# Patient Record
Sex: Male | Born: 1975 | Race: White | Hispanic: No | Marital: Married
Health system: Southern US, Community
[De-identification: ages and names within clinical notes are randomized; demographics above are authoritative.]

---

## 2021-04-11 ENCOUNTER — Emergency Department (HOSPITAL_COMMUNITY): Payer: 59

## 2021-04-11 ENCOUNTER — Emergency Department (HOSPITAL_COMMUNITY)
Admission: EM | Admit: 2021-04-11 | Discharge: 2021-04-12 | Disposition: A | Discharge status: Home / Self Care | Payer: 59 | Attending: Emergency Medicine | Admitting: Emergency Medicine

## 2021-04-11 DIAGNOSIS — I10 Essential (primary) hypertension: Secondary | ICD-10-CM | POA: Insufficient documentation

## 2021-04-11 DIAGNOSIS — R079 Chest pain, unspecified: Secondary | ICD-10-CM | POA: Insufficient documentation

## 2021-04-11 DIAGNOSIS — M25512 Pain in left shoulder: Secondary | ICD-10-CM | POA: Insufficient documentation

## 2021-04-11 LAB — HEPATIC FUNCTION PANEL
ALT: 24 U/L (ref 0–44)
AST: 21 U/L (ref 15–41)
Albumin: 4.6 g/dL (ref 3.5–5.0)
Alkaline Phosphatase: 47 U/L (ref 38–126)
Bilirubin, Direct: <0.1 mg/dL (ref 0.0–0.2)
Total Bilirubin: 0.7 mg/dL (ref 0.3–1.2)
Total Protein: 7.1 g/dL (ref 6.5–8.1)

## 2021-04-11 LAB — CBC
HCT: 40.1 % (ref 39.0–52.0)
Hemoglobin: 13.8 g/dL (ref 13.0–17.0)
MCH: 31.3 pg (ref 26.0–34.0)
MCHC: 34.4 g/dL (ref 30.0–36.0)
MCV: 90.9 fL (ref 80.0–100.0)
Platelets: 228 10*3/uL (ref 150–400)
RBC: 4.41 MIL/uL (ref 4.22–5.81)
RDW: 14.2 % (ref 11.5–15.5)
WBC: 9.6 10*3/uL (ref 4.0–10.5)
nRBC: 0 % (ref 0.0–0.2)

## 2021-04-11 LAB — BASIC METABOLIC PANEL
Anion gap: 8 (ref 5–15)
BUN: 13 mg/dL (ref 6–20)
CO2: 26 mmol/L (ref 22–32)
Calcium: 9.9 mg/dL (ref 8.9–10.3)
Chloride: 104 mmol/L (ref 98–111)
Creatinine, Ser: 1 mg/dL (ref 0.61–1.24)
GFR, Estimated: >60 mL/min (ref >60)
Glucose, Bld: 95 mg/dL (ref 70–99)
Potassium: 3.6 mmol/L (ref 3.5–5.1)
Sodium: 138 mmol/L (ref 135–145)

## 2021-04-11 LAB — LIPASE, BLOOD: Lipase: 48 U/L (ref 11–51)

## 2021-04-11 LAB — TROPONIN I (HIGH SENSITIVITY): Troponin I (High Sensitivity): 5 ng/L (ref <18)

## 2021-04-11 NOTE — ED Triage Notes (Signed)
Pt c/o centralized "sharp," substernal CP, associated upper L arm/shoulder, back pain since this past weekend. Hx stress test, echo, compliant w all meds. ?

## 2021-04-11 NOTE — ED Provider Triage Note (Signed)
Emergency Medicine Provider Triage Evaluation Note ? ?Daniel Hawkins , a 46 y.o. male  was evaluated in triage.  Pt complains of chest pain.  Chest pain has been on and off since over the weekend.  He says it is right in his epigastric and substernal region.  He has associated shoulder blade pain, mid back pain, and left arm pain.  He is also noticed that his blood pressure has been elevated more than it normally is.  He was reading a blood pressure of systolic in the 140s which is much higher than normal.  He has been compliant with all of his blood pressure medications.  He did recently travel here from [place].  He denies any abnormal leg swelling or leg pain.  He has never had a blood clot.  He has had pretty thorough cardiology work-ups in the past and they have all been negative. ? ?Review of Systems  ?Positive:  ?Negative:  ? ?Physical Exam  ?BP (!) 153/100 (BP Location: Right Arm)   Pulse 72   Temp 98.6 ?F (37 ?C) (Oral)   Resp 16   SpO2 100%  ?Gen:   Awake, no distress   ?Resp:  Normal effort  ?MSK:   Moves extremities without difficulty  ?Other:  No epigastric tenderness. Pain not reproducible in chest and back. Pulses are equal. ? ?Medical Decision Making  ?Medically screening exam initiated at 9:48 PM.  Appropriate orders placed.  Daniel Hawkins was informed that the remainder of the evaluation will be completed by another provider, this initial triage assessment does not replace that evaluation, and the importance of remaining in the ED until their evaluation is complete. ? ? ? ?  ?Claudie Leach, PA-C ?04/11/21 2149 ? ?

## 2021-04-12 ENCOUNTER — Emergency Department (HOSPITAL_COMMUNITY): Payer: 59

## 2021-04-12 LAB — TROPONIN I (HIGH SENSITIVITY): Troponin I (High Sensitivity): 5 ng/L (ref <18)

## 2021-04-12 MED ORDER — METHOCARBAMOL 500 MG PO TABS: 500.0000 mg | ORAL_TABLET | Freq: Three times a day (TID) | ORAL | 0 refills | Status: AC | PRN

## 2021-04-12 MED ORDER — DICLOFENAC SODIUM 1 % EX GEL
4.0000 g | Freq: Four times a day (QID) | CUTANEOUS | 0 refills | Status: AC | PRN
Start: 2021-04-12 — End: ?

## 2021-04-12 NOTE — Discharge Instructions (Addendum)
You were seen in the emergency department today for chest pain.  Your work-up was overall reassuring.  Your blood work did not show any significant abnormalities and is detailed below.  Your chest x-ray was normal.  Your left shoulder x-ray did show some degenerative changes, this may be contributing to some of the symptoms you are having in the shoulder.  Your EKG and heart enzymes did not show findings of an acute heart attack. ? ?We are sending you home with Robaxin and diclofenac to try to help with pain. ? ?-Diclofenac gel: This is a nonsteroidal anti-inflammatory gel to help with pain and inflammation. ?- Robaxin- this is the muscle relaxer I have prescribed, this is meant to help with muscle tightness/spasms. Be aware that this medication may make you drowsy therefore the first time you take this it should be at a time you are in an environment where you can rest. Do not drive or operate heavy machinery when taking this medication. Do not drink alcohol or take other sedating medications with this medicine such as narcotics or benzodiazepines.  ? ?You make take Tylenol per over the counter dosing with these medications.  ? ?We have prescribed you new medication(s) today. Discuss the medications prescribed today with your pharmacist as they can have adverse effects and interactions with your other medicines including over the counter and prescribed medications. Seek medical evaluation if you start to experience new or abnormal symptoms after taking one of these medicines, seek care immediately if you start to experience difficulty breathing, feeling of your throat closing, facial swelling, or rash as these could be indications of a more serious allergic reaction ? ? ?Please follow-up with your specialists soon as possible additionally follow-up with your primary care provider.  Return to the emergency department for new or worsening symptoms or any other concerns. ? ?Results for orders placed or performed  during the hospital encounter of 04/11/21  ?Basic metabolic panel  ?Result Value Ref Range  ? Sodium 138 135 - 145 mmol/L  ? Potassium 3.6 3.5 - 5.1 mmol/L  ? Chloride 104 98 - 111 mmol/L  ? CO2 26 22 - 32 mmol/L  ? Glucose, Bld 95 70 - 99 mg/dL  ? BUN 13 6 - 20 mg/dL  ? Creatinine, Ser 1.00 0.61 - 1.24 mg/dL  ? Calcium 9.9 8.9 - 10.3 mg/dL  ? GFR, Estimated >60 >60 mL/min  ? Anion gap 8 5 - 15  ?CBC  ?Result Value Ref Range  ? WBC 9.6 4.0 - 10.5 K/uL  ? RBC 4.41 4.22 - 5.81 MIL/uL  ? Hemoglobin 13.8 13.0 - 17.0 g/dL  ? HCT 40.1 39.0 - 52.0 %  ? MCV 90.9 80.0 - 100.0 fL  ? MCH 31.3 26.0 - 34.0 pg  ? MCHC 34.4 30.0 - 36.0 g/dL  ? RDW 14.2 11.5 - 15.5 %  ? Platelets 228 150 - 400 K/uL  ? nRBC 0.0 0.0 - 0.2 %  ?Hepatic function panel  ?Result Value Ref Range  ? Total Protein 7.1 6.5 - 8.1 g/dL  ? Albumin 4.6 3.5 - 5.0 g/dL  ? AST 21 15 - 41 U/L  ? ALT 24 0 - 44 U/L  ? Alkaline Phosphatase 47 38 - 126 U/L  ? Total Bilirubin 0.7 0.3 - 1.2 mg/dL  ? Bilirubin, Direct <0.1 0.0 - 0.2 mg/dL  ? Indirect Bilirubin NOT CALCULATED 0.3 - 0.9 mg/dL  ?Lipase, blood  ?Result Value Ref Range  ? Lipase 48 11 - 51  U/L  ?Troponin I (High Sensitivity)  ?Result Value Ref Range  ? Troponin I (High Sensitivity) 5 <18 ng/L  ?Troponin I (High Sensitivity)  ?Result Value Ref Range  ? Troponin I (High Sensitivity) 5 <18 ng/L  ? ?DG Chest 1 View ? ?Result Date: 04/11/2021 ?CLINICAL DATA:  Sharp substernal chest pain. EXAM: CHEST  1 VIEW COMPARISON:  None. FINDINGS: The heart size and mediastinal contours are within normal limits. Both lungs are clear. The visualized skeletal structures are unremarkable. IMPRESSION: No active disease. Electronically Signed   By: Elgie Collard M.D.   On: 04/11/2021 22:16  ? ?DG Shoulder Left ? ?Result Date: 04/12/2021 ?CLINICAL DATA:  Pain in shoulder and clicking. EXAM: LEFT SHOULDER - 2+ VIEW COMPARISON:  None. FINDINGS: There is no evidence of fracture or dislocation. Mild degenerative changes are present at  the glenohumeral joint. Soft tissues are unremarkable. IMPRESSION: 1. No acute fracture or dislocation. 2. 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 Electronically Signed   By: Thornell Sartorius M.D.   On: 04/12/2021 03:20   ? ? ?

## 2021-04-12 NOTE — ED Provider Notes (Signed)
?[name] ?Provider Note ? ? ?CSN: [id number] ?Arrival date & time: 04/11/21  2120 ? ?  ? ?History ? ?Chief Complaint  ?Patient presents with  ? Chest Pain  ? ? ?Daniel AcheJonathan Hawkins is a 46 y.o. male with a history of hypertension who presents to the ED with complaints of chest pain and shoulder pain that has been occurring x 12 months.  Patient reports has had intermittent pain to the chest and the left shoulder for about 1 year, he states that pain is now happening almost daily, it is intermittent, lasts a few seconds at a time, feels like his shoulder is clicking.  Pain seems to be worse with left upper extremity movement.  He has been worked up by [employer] [place].  He reports that he has had reassuring echocardiogram, stress test, and upper endoscopy.  At times will have some improvement in his pain with certain blood pressure medications, however his blood pressure medication management been altered multiple times.  He tried taking a PPI for a while as he had a hiatal hernia on his upper endoscopy, however this did not help the pain made him feel weird.  Today the pain was worse prompting ED visit.  He denies nausea, vomiting, dyspnea, dizziness, numbness, weakness, or syncope. ? ?HPI ? ?  ? ?Home Medications ?Prior to Admission medications   ?Not on File  ?   ? ?Allergies    ?Patient has no allergy information on record.   ? ?Review of Systems   ?Review of Systems  ?Constitutional:  Negative for chills, diaphoresis and fever.  ?Respiratory:  Negative for shortness of breath.   ?Cardiovascular:  Positive for chest pain.  ?Gastrointestinal:  Negative for nausea and vomiting.  ?Musculoskeletal:  Positive for arthralgias.  ?Neurological:  Negative for dizziness, seizures, weakness, light-headedness and numbness.  ?All other systems reviewed and are negative. ? ?Physical Exam ?Updated Vital Signs ?BP (!) 136/98 (BP Location: Right Arm)   Pulse  84   Temp 98.6 ?F (37 ?C) (Oral)   Resp 16   SpO2 99%  ?Physical Exam ?Vitals and nursing note reviewed.  ?Constitutional:   ?   General: He is not in acute distress. ?   Appearance: Normal appearance. He is well-developed. He is not ill-appearing or toxic-appearing.  ?HENT:  ?   Head: Normocephalic and atraumatic.  ?Eyes:  ?   General:     ?   Right eye: No discharge.     ?   Left eye: No discharge.  ?   Conjunctiva/sclera: Conjunctivae normal.  ?Neck:  ?   Comments: No midline tenderness.  ?Cardiovascular:  ?   Rate and Rhythm: Normal rate and regular rhythm.  ?   Pulses:     ?     Radial pulses are 2+ on the right side and 2+ on the left side.  ?Pulmonary:  ?   Effort: No respiratory distress.  ?   Breath sounds: Normal breath sounds. No wheezing or rales.  ?Chest:  ?   Chest wall: No tenderness.  ?Abdominal:  ?   General: There is no distension.  ?   Palpations: Abdomen is soft.  ?   Tenderness: There is no abdominal tenderness.  ?Musculoskeletal:  ?   Cervical back: Normal range of motion and neck supple.  ?   Right lower leg: No edema.  ?   Left lower leg: No edema.  ?   Comments: Upper  extremities: No obvious deformity, appreciable swelling, edema, erythema, ecchymosis, warmth, or open wounds. Patient has intact AROM throughout.  No focal bony tenderness. ?No midline spinal tenderness.  ?Skin: ?   General: Skin is warm and dry.  ?   Capillary Refill: Capillary refill takes less than 2 seconds.  ?Neurological:  ?   Mental Status: He is alert.  ?   Comments: Alert. Clear speech. Sensation grossly intact to bilateral upper extremities. 5/5 symmetric grip strength. Ambulatory.   ?Psychiatric:     ?   Mood and Affect: Mood normal.     ?   Behavior: Behavior normal.  ? ? ?ED Results / Procedures / Treatments   ?Labs ?(all labs ordered are listed, but only abnormal results are displayed) ?Labs Reviewed  ?BASIC METABOLIC PANEL  ?CBC  ?HEPATIC FUNCTION PANEL  ?LIPASE, BLOOD  ?TROPONIN I (HIGH SENSITIVITY)   ?TROPONIN I (HIGH SENSITIVITY)  ? ? ?EKG ?None ? ?Radiology ?DG Chest 1 View ? ?Result Date: 04/11/2021 ?CLINICAL DATA:  Sharp substernal chest pain. EXAM: CHEST  1 VIEW COMPARISON:  None. FINDINGS: The heart size and mediastinal contours are within normal limits. Both lungs are clear. The visualized skeletal structures are unremarkable. IMPRESSION: No active disease. Electronically Signed   By: Elgie Collard M.D.   On: 04/11/2021 22:16  ? ?DG Shoulder Left ? ?Result Date: 04/12/2021 ?CLINICAL DATA:  Pain in shoulder and clicking. EXAM: LEFT SHOULDER - 2+ VIEW COMPARISON:  None. FINDINGS: There is no evidence of fracture or dislocation. Mild degenerative changes are present at the glenohumeral joint. Soft tissues are unremarkable. IMPRESSION: 1. No acute fracture or dislocation. 2. 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 Electronically Signed   By: Thornell Sartorius M.D.   On: 04/12/2021 03:20   ? ?Procedures ?Procedures  ? ? ?Medications Ordered in ED ?Medications - No data to display ? ?ED Course/ Medical Decision Making/ A&P ?  ?                        ?Medical Decision Making ?Amount and/or Complexity of Data Reviewed ?Radiology: ordered. ? ?Risk ?Prescription drug management. ? ?Patient presents to the emergency department with chest pain. Patient nontoxic appearing, in no apparent distress, vitals without significant abnormality, BP elevated some- low suspicion for HTN emergency. Fairly benign physical exam.  ? ?DDX including but not limited to: ACS, pulmonary embolism, dissection, pneumothorax, pneumonia, arrhythmia, severe anemia, MSK, GERD, anxiety, abdominal process.  ? ?Additional history obtained:  ?Nursing note reviewed.  I also reviewed patient's MyChart data on his cellular device with his permission-he has had multiple D-dimers that have been normal in the past.  He had an upper endoscopy that was overall reassuring with exception of findings of small hiatal hernia.  He reports to me that  he had a normal stress test and echocardiogram.  He is scheduled for a nuclear stress test with his cardiologist on April 12th however has been told by his cardiologist that they do not feel that this is likely cardiac.  He also has had reassuring rheumatologic work-up.  He has also had ABIs of the upper extremities that were unremarkable. ? ?EKG: No STEMI. ? ?Lab Tests:  ?I reviewed & interpreted labs including:  ?CBC, BMP, hepatic function panel, lipase, troponins: Unremarkable ? ?Imaging Studies ordered:  ?I ordered and viewed the following imaging, agree with radiologist impression:  ?Left shoulder x-ray: 1. No acute fracture or dislocation. 2. Mild degenerative changes at the glenohumeral  joint.  ?Chest x-ray was performed from triage: No active disease. ? ?ED Course:  ?Heart Pathway Score low risk- EKG without obvious acute ischemia, delta troponin negative, doubt ACS. Patient is low risk wells, has had negative ddimers w/ similar presentation, doubt pulmonary embolism. Pain is not a tearing sensation, symmetric pulses, no widening of mediastinum on CXR, doubt dissection.  Abdomen is nontender without peritoneal signs, do not suspect acute surgical process such as cholecystitis, perforation, or obstruction.  Lipase is normal making pancreatitis less likely.  Left shoulder x-ray with some degenerative changes, no acute process.  Patient is neurovascular intact distally.  He has had reassuring previous ABI.  No edema of the limb or tenderness to raise concern for DVT.  No signs of infection.  Overall unclear etiology to patient's symptoms, will trial treatment for musculoskeletal type pain and have him follow-up with his specialist. I discussed results, treatment plan, need for follow-up, and return precautions with the patient. Provided opportunity for questions, patient confirmed understanding and is in agreement with plan.  ? ?Based on patient's chief complaint, I considered admission might be necessary,  however after reassuring ED workup feel patient is reasonable for discharge.  ? ?Portions of this note were generated with Scientist, clinical (histocompatibility and immunogenetics). Dictation errors may occur despite best attempts at proofreading. ?

## 2023-03-29 IMAGING — CR DG CHEST 1V
1 series · 1 of 1 positions shown · non-contrast
Comparison: None.

CLINICAL DATA: Sharp substernal chest pain.

EXAM:
CHEST  1 VIEW

[chest pa]
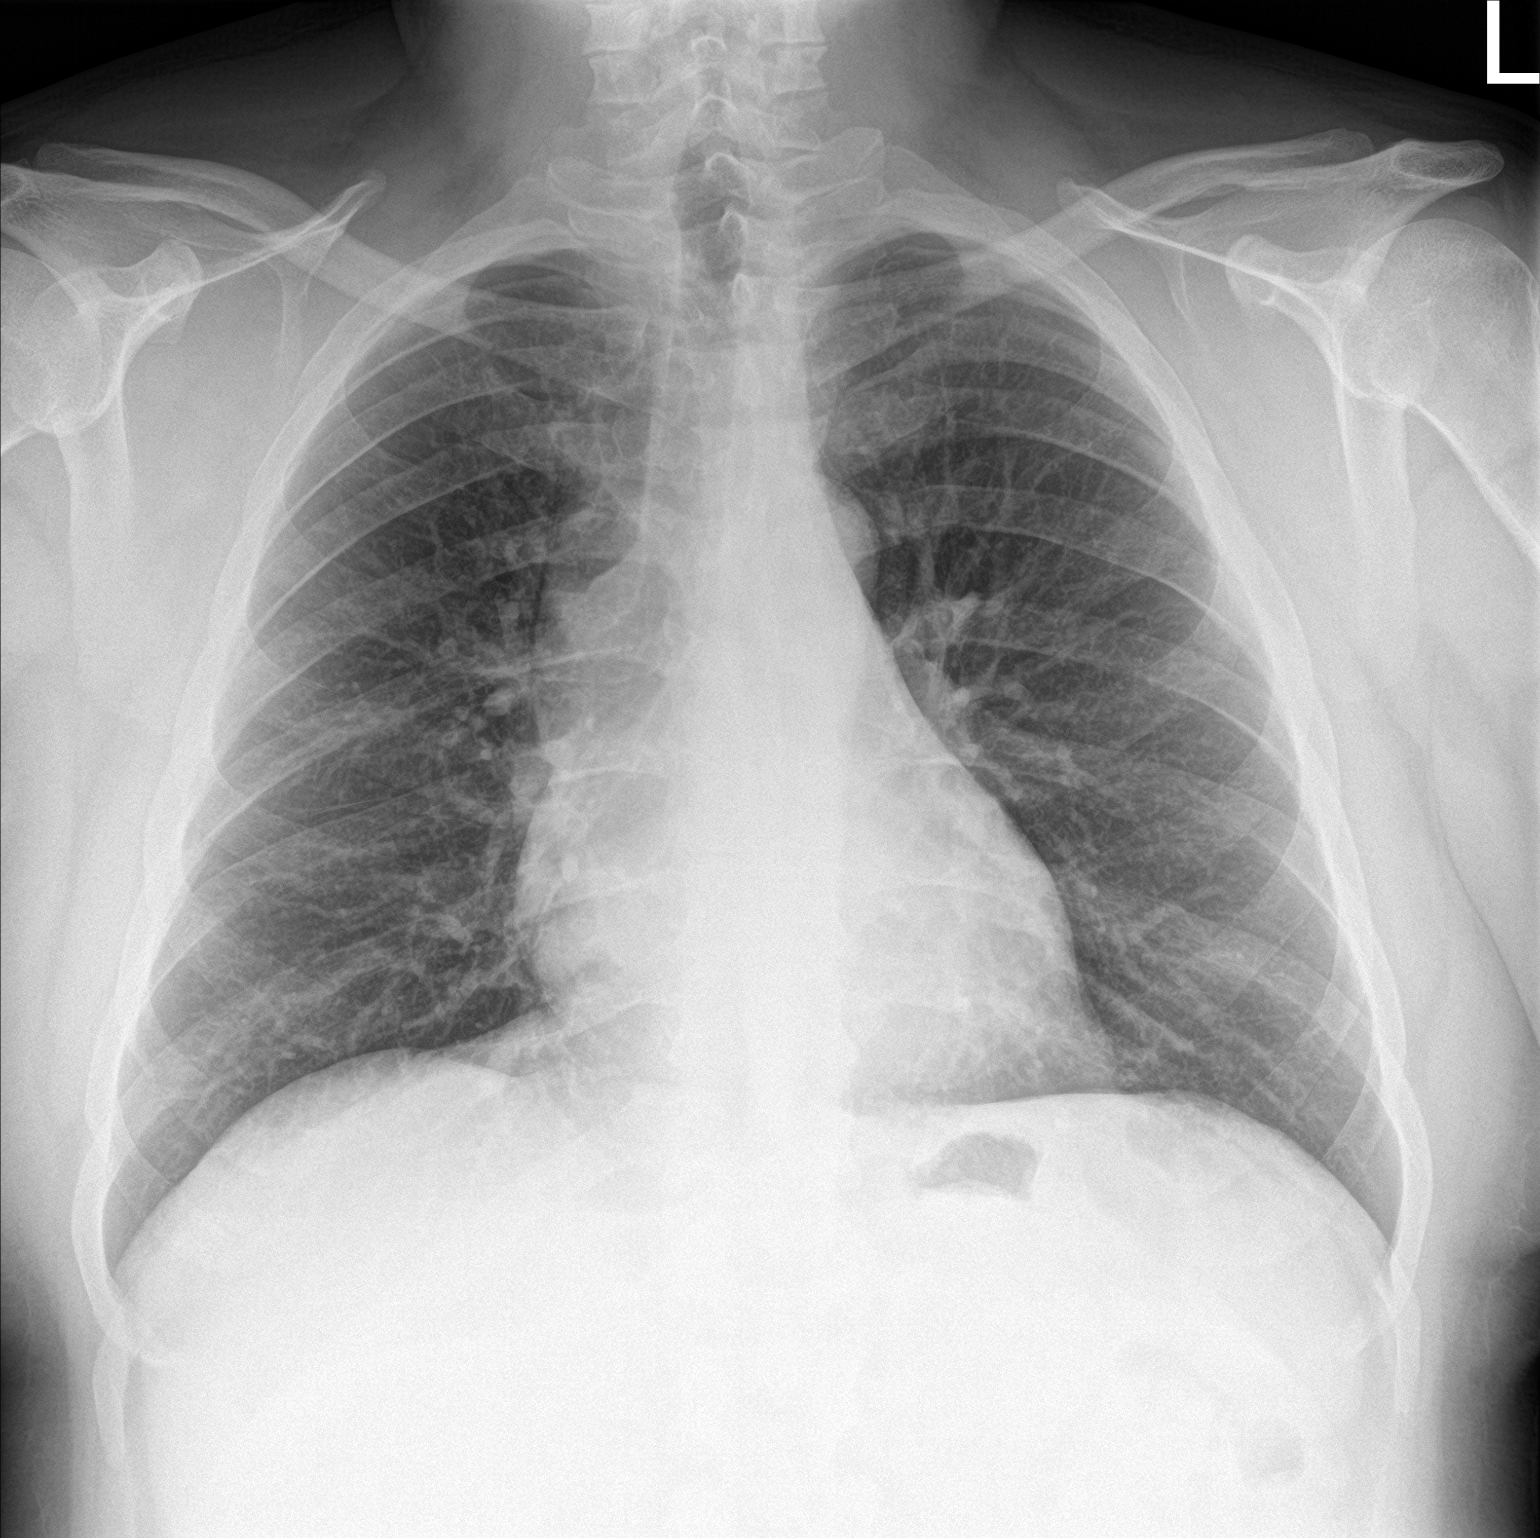

[1 of 1 positions shown; findings below may reference images not displayed]

FINDINGS: The heart size and mediastinal contours are within normal limits.
Both lungs are clear. The visualized skeletal structures are
unremarkable.
IMPRESSION: No active disease.

## 2023-03-30 IMAGING — CR DG SHOULDER 2+V*L*
4 series · 4 of 4 positions shown · non-contrast
Comparison: None.

CLINICAL DATA: Pain in shoulder and clicking.

EXAM:
LEFT SHOULDER - 2+ VIEW

[shoulder grashey]
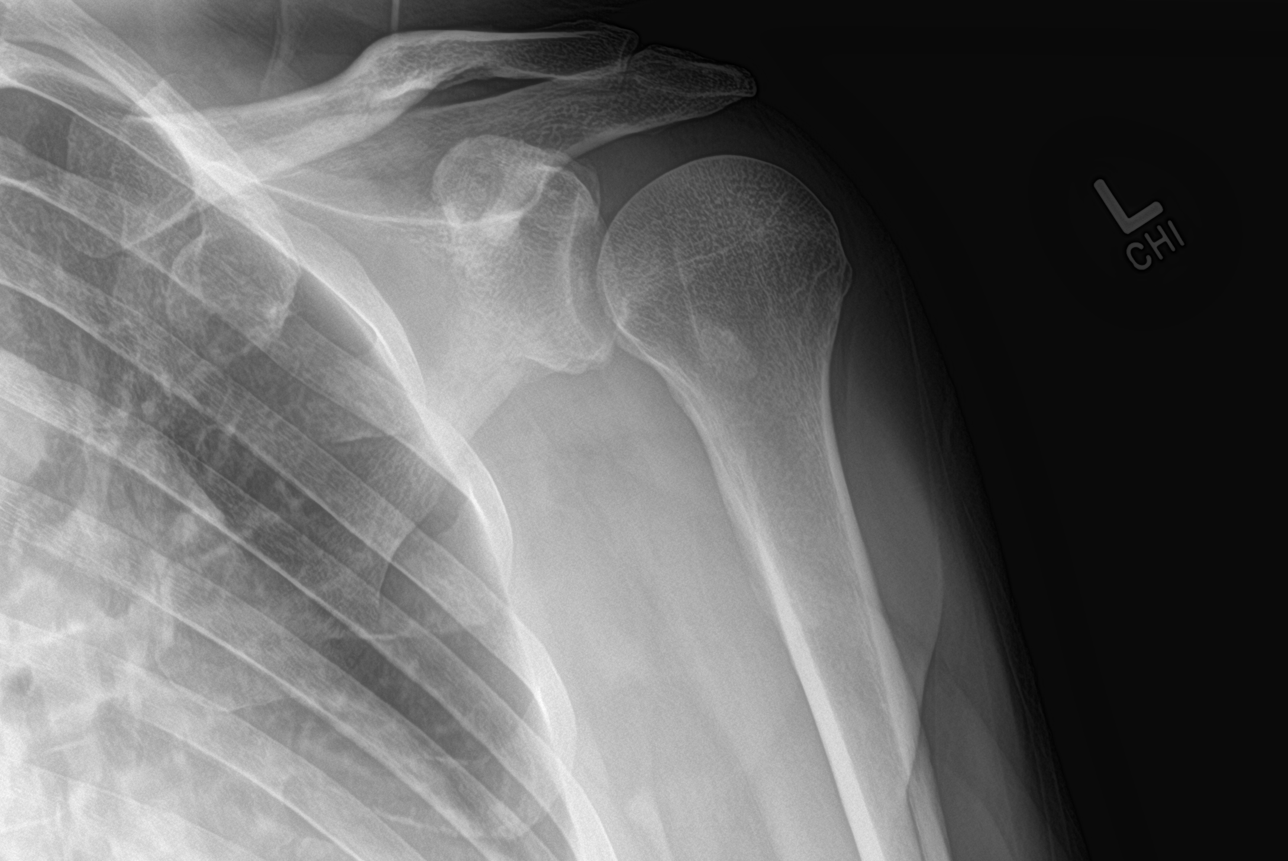

[shoulder y view]
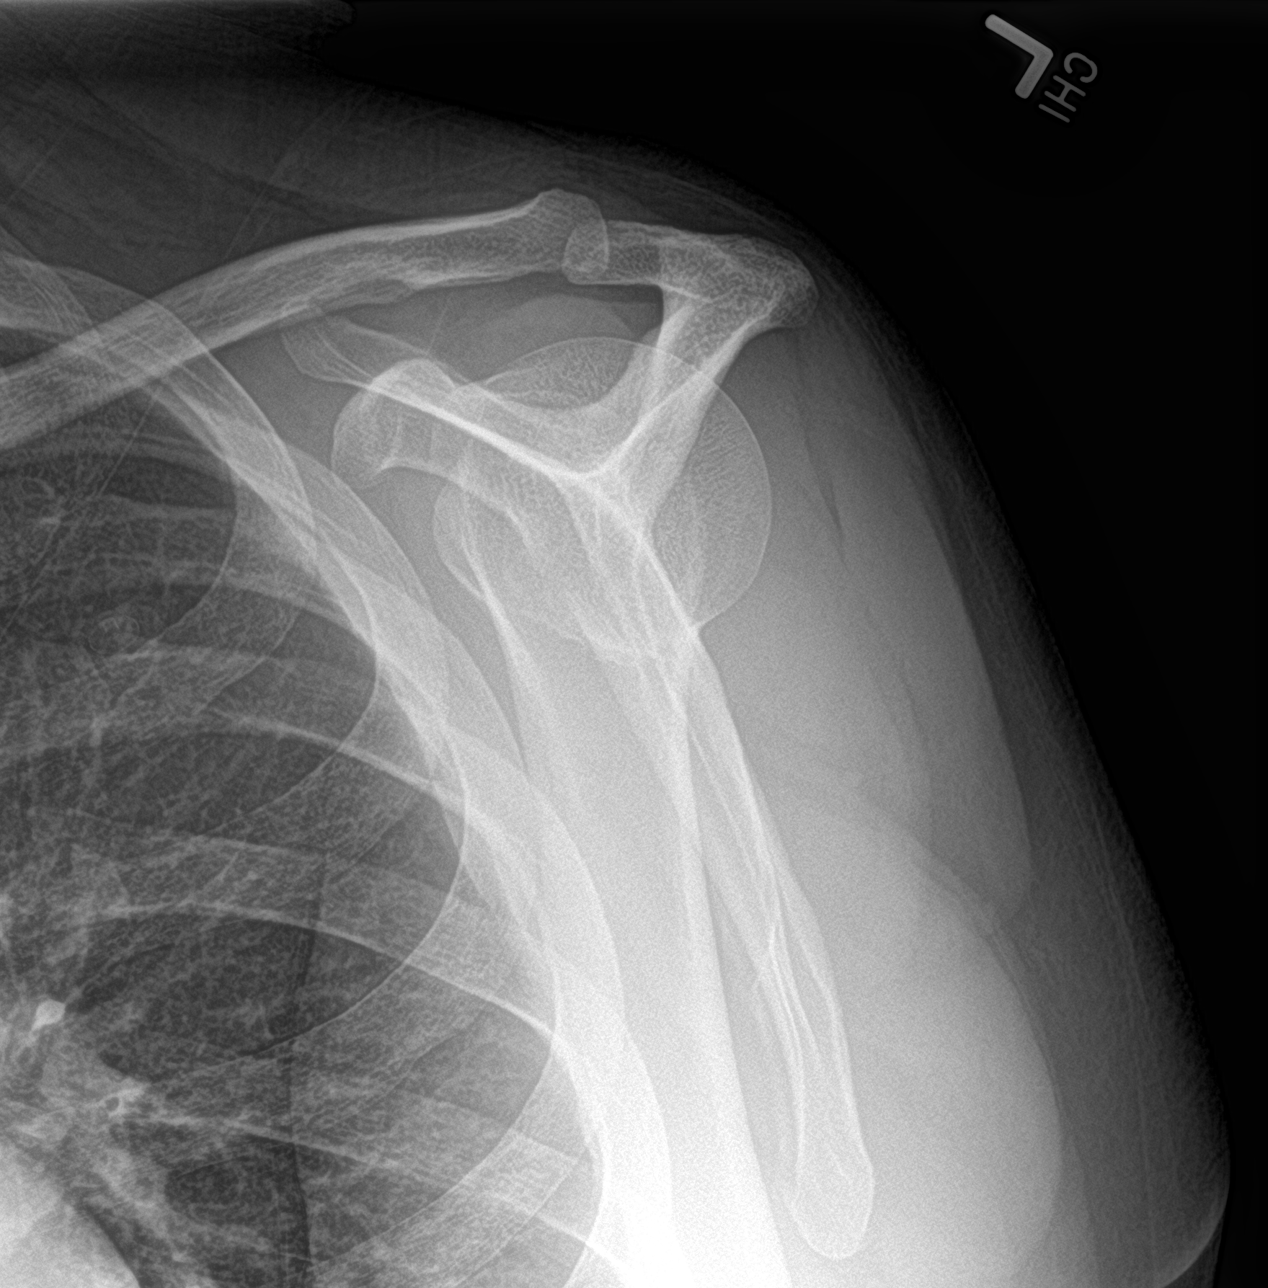

[shoulder axillary]
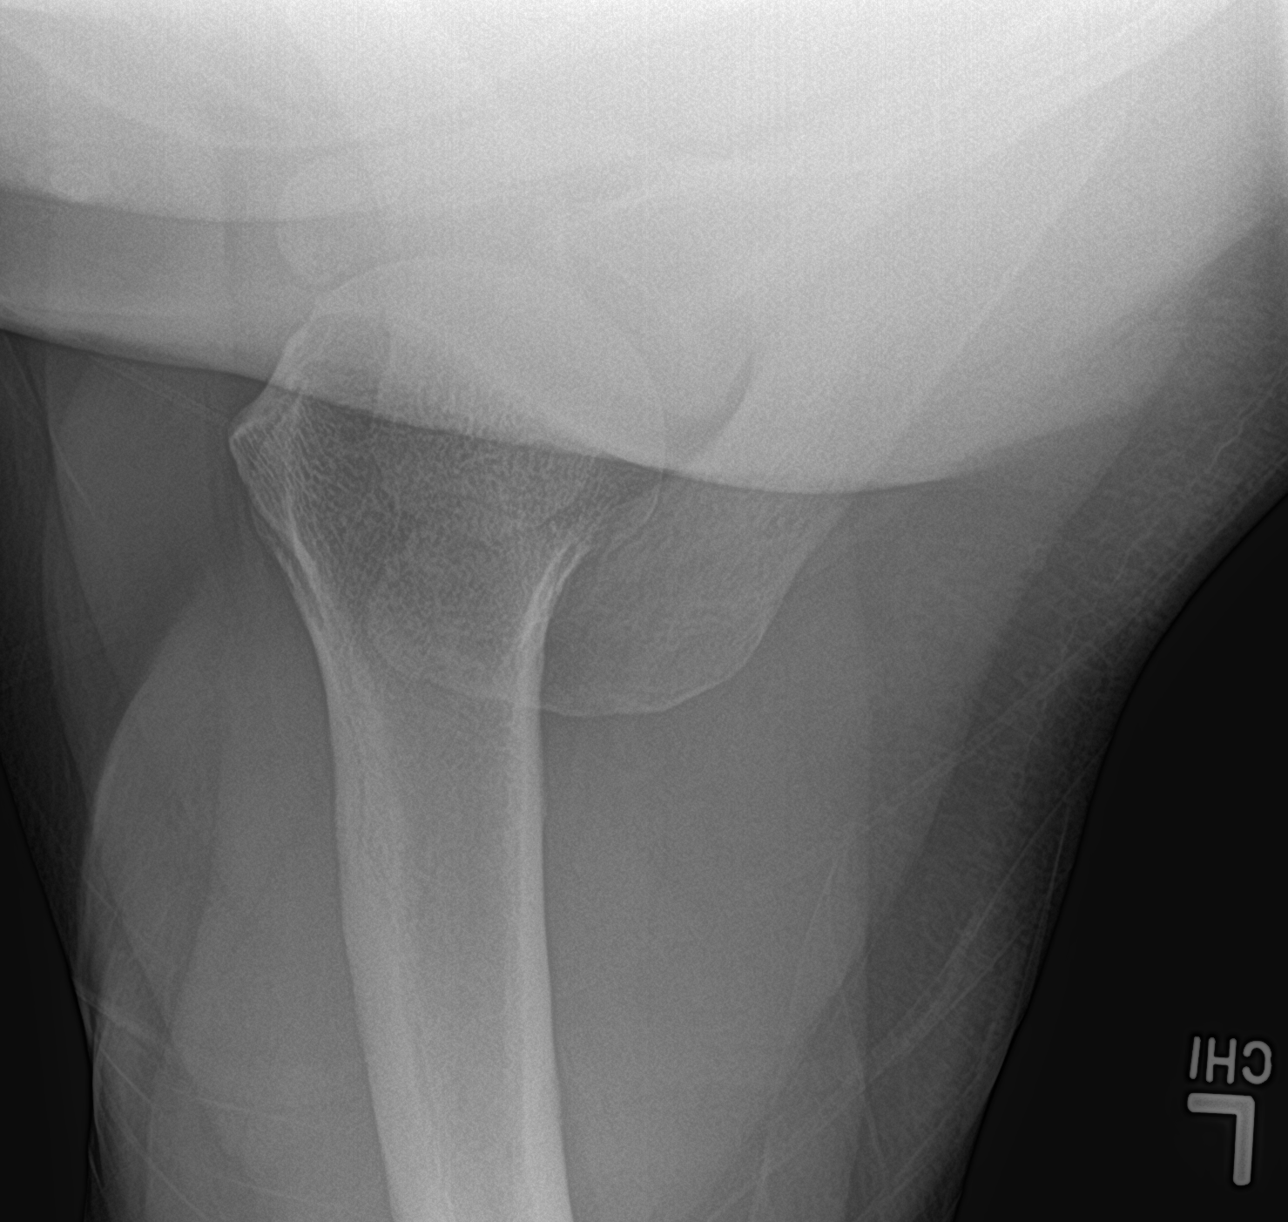

[shoulder ap neutral]
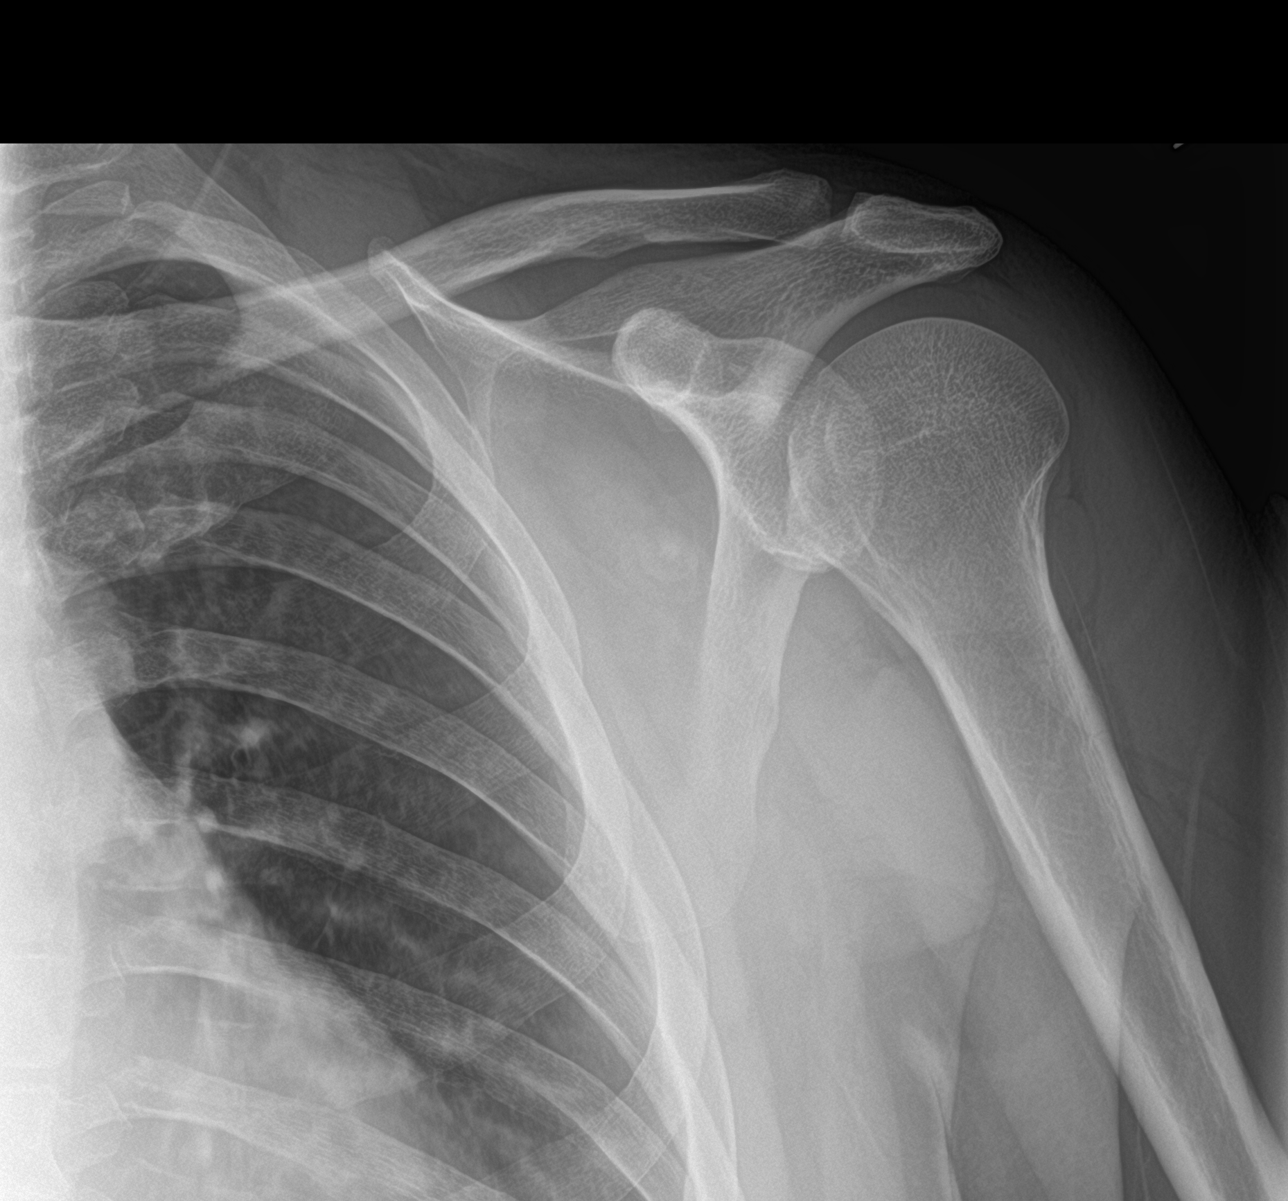

[4 of 4 positions shown; findings below may reference images not displayed]

FINDINGS: There is no evidence of fracture or dislocation. Mild degenerative
changes are present at the glenohumeral joint. Soft tissues are
unremarkable.
IMPRESSION: 1. No acute fracture or dislocation.
2. Mild degenerative changes at the glenohumeral joint.
